# Patient Record
Sex: Male | Born: 1960 | Race: White | Hispanic: No | Marital: Married | Smoking: Former smoker
Health system: Southern US, Community
[De-identification: ages and names within clinical notes are randomized; demographics above are authoritative.]

## PROBLEM LIST (undated history)

## (undated) DIAGNOSIS — E119 Type 2 diabetes mellitus without complications: Secondary | ICD-10-CM

## (undated) HISTORY — PX: OTHER SURGICAL HISTORY: SHX169

## (undated) HISTORY — PX: UMBILICAL HERNIA REPAIR: SHX196

## (undated) HISTORY — DX: Type 2 diabetes mellitus without complications: E11.9

---

## 2003-10-29 ENCOUNTER — Emergency Department (HOSPITAL_COMMUNITY): Admission: AD | Admit: 2003-10-29 | Discharge: 2003-10-29 | Payer: Self-pay | Admitting: Family Medicine

## 2004-05-25 ENCOUNTER — Emergency Department (HOSPITAL_COMMUNITY): Admission: EM | Admit: 2004-05-25 | Discharge: 2004-05-25 | Payer: Self-pay | Admitting: Family Medicine

## 2009-10-30 ENCOUNTER — Encounter: Admission: RE | Admit: 2009-10-30 | Discharge: 2009-10-30 | Payer: Self-pay | Admitting: General Surgery

## 2009-10-31 ENCOUNTER — Emergency Department (HOSPITAL_COMMUNITY): Admission: EM | Admit: 2009-10-31 | Discharge: 2009-10-31 | Payer: Self-pay | Admitting: Emergency Medicine

## 2010-05-11 ENCOUNTER — Emergency Department (HOSPITAL_COMMUNITY): Admission: EM | Admit: 2010-05-11 | Discharge: 2010-05-11 | Payer: Self-pay | Admitting: Family Medicine

## 2014-10-30 ENCOUNTER — Other Ambulatory Visit: Payer: Self-pay | Admitting: Internal Medicine

## 2014-10-30 ENCOUNTER — Ambulatory Visit
Admission: RE | Admit: 2014-10-30 | Discharge: 2014-10-30 | Disposition: A | Discharge status: Home / Self Care | Payer: BC Managed Care – PPO | Source: Ambulatory Visit | Attending: Internal Medicine | Admitting: Internal Medicine

## 2014-10-30 DIAGNOSIS — R200 Anesthesia of skin: Secondary | ICD-10-CM

## 2014-11-25 ENCOUNTER — Telehealth: Payer: Self-pay | Admitting: Neurology

## 2014-11-25 NOTE — Telephone Encounter (Signed)
Pt moved appt from 12-27-14 to 11-26-14

## 2014-11-26 ENCOUNTER — Encounter: Payer: Self-pay | Admitting: Neurology

## 2014-11-26 ENCOUNTER — Ambulatory Visit (INDEPENDENT_AMBULATORY_CARE_PROVIDER_SITE_OTHER): Payer: BLUE CROSS/BLUE SHIELD | Admitting: Neurology

## 2014-11-26 VITALS — BP 130/80 | HR 89 | Ht 71.0 in | Wt 236.1 lb

## 2014-11-26 DIAGNOSIS — M5417 Radiculopathy, lumbosacral region: Secondary | ICD-10-CM

## 2014-11-26 DIAGNOSIS — R292 Abnormal reflex: Secondary | ICD-10-CM

## 2014-11-26 DIAGNOSIS — R202 Paresthesia of skin: Secondary | ICD-10-CM

## 2014-11-26 NOTE — Patient Instructions (Signed)
1.  MRI brain wwo contrast 2.  We will request your prior MRI lumbar spine 3.  Please try to avoid hyperflexion of the elbow when sleeping 4.  If symptoms do not improve, please contact my office to schedule EMG

## 2014-11-26 NOTE — Progress Notes (Signed)
[facility] Clinic Note - Initial Visit   Date: 11/26/2014  [name] MRN: [id number] DOB: Aug 02, 1961   Dear Dr. Philip Aspen:  Thank you for your kind referral of Craig Moreno for consultation of left finger and toe paresthesias. Although his history is well known to you, please allow [place] to reiterate it for the purpose of our medical record. The patient was accompanied to the clinic by self.   History of Present Illness: Craig Moreno is a 54 y.o. right-handed Caucasian male with type 2 diabetes, hyperlipidemia, and lumbar disc hernation s/p L5-S1 diskectomy presenting for evaluation of left finger and toe paresthesias.    In October 2014, he developed severe left leg pain and was found to have disc herniation.  He underwent surgery in spring of 2015 which resolved his radicular pain.  A few months later, he developed dull pain over the top of the foot and was told he had a stress fracture.  He was placed in a boot from May until August 2015 which alleviated the pain over the dorsum of the foot, but he continues to have numbness of the toe.  He has no problems involving the right foot.  A few months ago he had left sided neck pain, but this resolved spontaneously.  Occasionally, he noticed tingling of the 5th digit (left > right) usually when he wakes up in the morning, but this improves within a few minutes.     Out-side paper records, electronic medical record, and images have been reviewed where available and summarized as:  XR cervical spine 10/30/2014:  Mild cervical spondylosis as described. No acute osseous findings, malalignment or osseous foraminal stenosis.  XR lumbar spine 10/30/2014:  Stable lower lumbar spondylosis and postsurgical changes. No acute osseous findings or malalignment.    Past Medical History  Diagnosis Date  . Diabetes     borderline    Past Surgical History  Procedure Laterality Date  . L5s1 surgery    . Umbilical  hernia repair       Medications:  Farixga 10mg  daily Pravastatin 20mg  daily Flonase 84mcg nasal spray Mobic 15mg  daily prn   Allergies:  Allergies  Allergen Reactions  . Penicillins     Family History: Family History  Problem Relation Age of Onset  . Multiple sclerosis Sister   . Crohn's disease Father     Social History: He sells Adult nurse estate. Highest level of education:  [employer] with wife and daughter.   Social History Main Topics  . Smoking status: Former Research scientist (life sciences)  . Smokeless tobacco: Never Used     Comment: quit about 30 years ago  . Alcohol Use: 0.0 oz/week    Rare    Review of Systems:  CONSTITUTIONAL: No fevers, chills, night sweats, or weight loss.   EYES: No visual changes or eye pain ENT: No hearing changes.  No history of nose bleeds.   RESPIRATORY: No cough, wheezing and shortness of breath.   CARDIOVASCULAR: Negative for chest pain, and palpitations.   GI: Negative for abdominal discomfort, blood in stools or black stools.  No recent change in bowel habits.   GU:  No history of incontinence.   MUSCLOSKELETAL: No history of joint pain or swelling.  No myalgias.   SKIN: Negative for lesions, rash, and itching.   HEMATOLOGY/ONCOLOGY: Negative for prolonged bleeding, bruising easily, and swollen nodes.  No history of cancer.   ENDOCRINE: Negative for cold or heat intolerance, polydipsia or goiter.  PSYCH:  No depression or anxiety symptoms.   NEURO: As Above.   Vital Signs:  BP 130/80 mmHg  Pulse 89  Ht 5\' 11"  (1.803 m)  Wt 236 lb 2 oz (107.106 kg)  BMI 32.95 kg/m2  SpO2 96%   General Medical Exam:   General:  Well appearing, comfortable.   Eyes/ENT: see cranial nerve examination.   Neck: No masses appreciated.  Full range of motion without tenderness.  No carotid bruits. Respiratory:  Clear to auscultation, good air entry bilaterally.   Cardiac:  Regular rate and rhythm, no murmur.   Extremities:  No deformities, edema, or  skin discoloration.  Skin:  No rashes or lesions.  Neurological Exam: MENTAL STATUS including orientation to time, place, person, recent and remote memory, attention span and concentration, language, and fund of knowledge is normal.  Speech is not dysarthric.  CRANIAL NERVES: II:  No visual field defects.  Unremarkable fundi.   III-IV-VI: Pupils equal round and reactive to light.  Normal conjugate, extra-ocular eye movements in all directions of gaze.  No nystagmus.  No ptosis.   V:  Normal facial sensation.  Jaw jerk is absent.   VII:  Normal facial symmetry and movements.  No pathologic facial reflexes.  VIII:  Normal hearing and vestibular function.   IX-X:  Normal palatal movement.   XI:  Normal shoulder shrug and head rotation.   XII:  Normal tongue strength and range of motion, no deviation or fasciculation.  MOTOR:  No atrophy, fasciculations or abnormal movements.  No pronator drift.  Tone is normal.    Right Upper Extremity:    Left Upper Extremity:    Deltoid  5/5   Deltoid  5/5   Biceps  5/5   Biceps  5/5   Triceps  5/5   Triceps  5/5   Wrist extensors  5/5   Wrist extensors  5/5   Wrist flexors  5/5   Wrist flexors  5/5   Finger extensors  5/5   Finger extensors  5/5   Finger flexors  5/5   Finger flexors  5/5   Dorsal interossei  5/5   Dorsal interossei  5/5   Abductor pollicis  5/5   Abductor pollicis  5/5   Tone (Ashworth scale)  0  Tone (Ashworth scale)  0   Right Lower Extremity:    Left Lower Extremity:    Hip flexors  5/5   Hip flexors  5/5   Hip extensors  5/5   Hip extensors  5/5   Knee flexors  5/5   Knee flexors  5/5   Knee extensors  5/5   Knee extensors  5/5   Dorsiflexors  5/5   Dorsiflexors  5/5   Plantarflexors  5/5   Plantarflexors  5/5   Toe extensors  5/5   Toe extensors  5/5   Toe flexors  5/5   Toe flexors  5/5   Tone (Ashworth scale)  0  Tone (Ashworth scale)  0   MSRs:  Right                                                                  Left brachioradialis 3+  brachioradialis 3+  biceps 3+  biceps 3+  triceps 3+  triceps 3+  patellar 3+  patellar 3+  ankle jerk 3+  ankle jerk 3+  Hoffman no  Hoffman no  plantar response down  plantar response down   SENSORY:  Normal and symmetric perception of light touch, pinprick, vibration, and proprioception.  Romberg's sign absent.   COORDINATION/GAIT: Normal finger-to- nose-finger and heel-to-shin.  Intact rapid alternating movements bilaterally.  Able to rise from a chair without using arms.  Gait narrow based and stable. Tandem and stressed gait intact.    IMPRESSION: Mr. Behrle is a 54 year-old gentleman presenting for evaluation of bilateral 5th digit paresthesias and left 5th toe paresthesias.  Exam shows generalized hyperreflexia without other upper motor neuron findings and although this may be normal for patient, with his multifocal paresthesias, I will obtain MRI brain to exclude demyelinating disease.  If imaging is negative, symptoms are most likely due to (1) mild ulnar neuropathy at the elbow vs C8 radiculopathy and (2) possible left S1 radiculopathy vs sural mononeuropathy.  We will request MRI lumbar spine report from 2014 to see if there was, indeed, nerve impingement affecting the left S1 nerve root.     PLAN: MRI brain wwo contrast Recommend EMG of the left arm and leg, if symptoms worsen. Patient declined neurlagesic medication as paresthesias are not painful. Results will be communicated to patient via telephone Return to clinic as needed   The duration of this appointment visit was 45 minutes of face-to-face time with the patient.  Greater than 50% of this time was spent in counseling, explanation of diagnosis, planning of further management, and coordination of care.   Thank you for allowing me to participate in patient's care.  If I can answer any additional questions, I would be pleased to do so.    Sincerely,    Donika K. Posey Pronto, DO

## 2014-11-26 NOTE — Progress Notes (Signed)
Note faxed.

## 2014-11-28 NOTE — Progress Notes (Signed)
Addendum MRI lumbar spine 09/25/2013:  L5-S1: Moderate left central caudal extrusion superimposed on retrolisthesis of L5 on S1 and posterior endplate spurs displaces the left S1 root. Signs of left hemi-laminectomy.

## 2014-12-01 ENCOUNTER — Inpatient Hospital Stay: Admission: RE | Admit: 2014-12-01 | Payer: BLUE CROSS/BLUE SHIELD | Source: Ambulatory Visit

## 2014-12-16 ENCOUNTER — Telehealth: Payer: Self-pay | Admitting: Neurology

## 2014-12-16 NOTE — Telephone Encounter (Signed)
Pt called/returning your call.  C/b [phone]

## 2014-12-16 NOTE — Telephone Encounter (Signed)
Pt wants to know the status of the MRI resch please call [phone]

## 2014-12-16 NOTE — Telephone Encounter (Signed)
I spoke with patient. He was checking on the status of getting his MRI rescheduled which was cancelled on 1/10 due to not being precerted. I told patient that I would call the precert coordinator to check on the status of pre cert & I would give him a callback.  I called Janelle & she said she has pre-certed scan & patient could go ahead and reschedule scan.  I called patient &  Left a msg on his vm that he could call [facility] @ [phone] to reschedule his scan they still have the original order.

## 2014-12-16 NOTE — Telephone Encounter (Signed)
Lmom to return my call. 

## 2014-12-27 ENCOUNTER — Ambulatory Visit: Payer: BC Managed Care – PPO | Admitting: Neurology

## 2014-12-29 ENCOUNTER — Inpatient Hospital Stay: Admission: RE | Admit: 2014-12-29 | Payer: BLUE CROSS/BLUE SHIELD | Source: Ambulatory Visit

## 2016-03-10 DIAGNOSIS — H5203 Hypermetropia, bilateral: Secondary | ICD-10-CM | POA: Diagnosis not present

## 2016-03-16 DIAGNOSIS — Z0000 Encounter for general adult medical examination without abnormal findings: Secondary | ICD-10-CM | POA: Diagnosis not present

## 2016-03-16 DIAGNOSIS — R7301 Impaired fasting glucose: Secondary | ICD-10-CM | POA: Diagnosis not present

## 2016-03-16 DIAGNOSIS — Z125 Encounter for screening for malignant neoplasm of prostate: Secondary | ICD-10-CM | POA: Diagnosis not present

## 2016-03-22 DIAGNOSIS — R7301 Impaired fasting glucose: Secondary | ICD-10-CM | POA: Diagnosis not present

## 2016-03-22 DIAGNOSIS — M25512 Pain in left shoulder: Secondary | ICD-10-CM | POA: Diagnosis not present

## 2016-03-22 DIAGNOSIS — E668 Other obesity: Secondary | ICD-10-CM | POA: Diagnosis not present

## 2016-03-22 DIAGNOSIS — Z0000 Encounter for general adult medical examination without abnormal findings: Secondary | ICD-10-CM | POA: Diagnosis not present

## 2016-03-22 DIAGNOSIS — R5383 Other fatigue: Secondary | ICD-10-CM | POA: Diagnosis not present

## 2016-03-22 DIAGNOSIS — Z1389 Encounter for screening for other disorder: Secondary | ICD-10-CM | POA: Diagnosis not present

## 2016-03-26 DIAGNOSIS — Z1212 Encounter for screening for malignant neoplasm of rectum: Secondary | ICD-10-CM | POA: Diagnosis not present

## 2016-10-01 DIAGNOSIS — M546 Pain in thoracic spine: Secondary | ICD-10-CM | POA: Diagnosis not present

## 2016-10-01 DIAGNOSIS — M5416 Radiculopathy, lumbar region: Secondary | ICD-10-CM | POA: Diagnosis not present

## 2016-10-01 DIAGNOSIS — M549 Dorsalgia, unspecified: Secondary | ICD-10-CM | POA: Diagnosis not present

## 2016-10-01 DIAGNOSIS — M5136 Other intervertebral disc degeneration, lumbar region: Secondary | ICD-10-CM | POA: Diagnosis not present

## 2016-10-01 DIAGNOSIS — M47816 Spondylosis without myelopathy or radiculopathy, lumbar region: Secondary | ICD-10-CM | POA: Diagnosis not present

## 2016-10-08 DIAGNOSIS — J40 Bronchitis, not specified as acute or chronic: Secondary | ICD-10-CM | POA: Diagnosis not present

## 2016-10-08 DIAGNOSIS — Z6830 Body mass index (BMI) 30.0-30.9, adult: Secondary | ICD-10-CM | POA: Diagnosis not present

## 2016-11-09 DIAGNOSIS — J069 Acute upper respiratory infection, unspecified: Secondary | ICD-10-CM | POA: Diagnosis not present

## 2016-11-09 DIAGNOSIS — R05 Cough: Secondary | ICD-10-CM | POA: Diagnosis not present

## 2017-03-21 DIAGNOSIS — E784 Other hyperlipidemia: Secondary | ICD-10-CM | POA: Diagnosis not present

## 2017-03-21 DIAGNOSIS — Z125 Encounter for screening for malignant neoplasm of prostate: Secondary | ICD-10-CM | POA: Diagnosis not present

## 2017-03-21 DIAGNOSIS — R7301 Impaired fasting glucose: Secondary | ICD-10-CM | POA: Diagnosis not present

## 2017-03-28 DIAGNOSIS — Z0000 Encounter for general adult medical examination without abnormal findings: Secondary | ICD-10-CM | POA: Diagnosis not present

## 2017-03-28 DIAGNOSIS — E784 Other hyperlipidemia: Secondary | ICD-10-CM | POA: Diagnosis not present

## 2017-03-28 DIAGNOSIS — E669 Obesity, unspecified: Secondary | ICD-10-CM | POA: Diagnosis not present

## 2017-03-28 DIAGNOSIS — Z1389 Encounter for screening for other disorder: Secondary | ICD-10-CM | POA: Diagnosis not present

## 2017-03-28 DIAGNOSIS — R7301 Impaired fasting glucose: Secondary | ICD-10-CM | POA: Diagnosis not present

## 2017-03-29 DIAGNOSIS — Z1212 Encounter for screening for malignant neoplasm of rectum: Secondary | ICD-10-CM | POA: Diagnosis not present

## 2017-06-16 DIAGNOSIS — M25562 Pain in left knee: Secondary | ICD-10-CM | POA: Diagnosis not present

## 2017-06-27 DIAGNOSIS — M25562 Pain in left knee: Secondary | ICD-10-CM | POA: Diagnosis not present

## 2017-07-01 DIAGNOSIS — M25562 Pain in left knee: Secondary | ICD-10-CM | POA: Diagnosis not present

## 2017-07-15 DIAGNOSIS — H5201 Hypermetropia, right eye: Secondary | ICD-10-CM | POA: Diagnosis not present

## 2017-09-17 DIAGNOSIS — Z23 Encounter for immunization: Secondary | ICD-10-CM | POA: Diagnosis not present

## 2018-02-17 ENCOUNTER — Ambulatory Visit (INDEPENDENT_AMBULATORY_CARE_PROVIDER_SITE_OTHER): Payer: BLUE CROSS/BLUE SHIELD | Admitting: Internal Medicine

## 2018-02-17 ENCOUNTER — Encounter: Payer: Self-pay | Admitting: Internal Medicine

## 2018-02-17 DIAGNOSIS — Z7184 Encounter for health counseling related to travel: Secondary | ICD-10-CM

## 2018-02-17 DIAGNOSIS — Z9189 Other specified personal risk factors, not elsewhere classified: Secondary | ICD-10-CM

## 2018-02-17 DIAGNOSIS — Z7185 Encounter for immunization safety counseling: Secondary | ICD-10-CM

## 2018-02-17 DIAGNOSIS — Z23 Encounter for immunization: Secondary | ICD-10-CM

## 2018-02-17 DIAGNOSIS — Z7189 Other specified counseling: Secondary | ICD-10-CM | POA: Diagnosis not present

## 2018-02-17 DIAGNOSIS — Z0000 Encounter for general adult medical examination without abnormal findings: Secondary | ICD-10-CM

## 2018-02-17 DIAGNOSIS — Z789 Other specified health status: Secondary | ICD-10-CM

## 2018-02-17 MED ORDER — ATOVAQUONE-PROGUANIL HCL 250-100 MG PO TABS: 1.0000 | ORAL_TABLET | Freq: Every day | ORAL | 0 refills | Status: DC

## 2018-02-17 MED ORDER — AZITHROMYCIN 500 MG PO TABS: 1000.0000 mg | ORAL_TABLET | Freq: Once | ORAL | 0 refills | Status: AC

## 2018-02-17 NOTE — Progress Notes (Signed)
Subjective:   Craig Moreno is a 57 y.o. male who presents to the Infectious Disease clinic for travel consultation. Planned departure date: May 2019          Planned return date: 2 weeks Countries of travel: [place] and [place] and [place] Areas in country: safari   Accommodations: safari Purpose of travel: vacation Prior travel out of [place]: yes     Objective:   Medications: reviewed    Assessment:   No contraindications to travel. none     Plan:    Issues discussed: environmental concerns, freshwater swimming, future shots, insect-borne illnesses, malaria, MVA safety, rabies, safe food/water, traveler's diarrhea, website/handouts for more information, what to do if ill upon return, what to do if ill while there and Yellow Fever. Immunizations recommended: Hepatitis A series, Td and Typhoid (parenteral). Malaria prophylaxis: malarone, daily dose starting 1-2 days before entering endemic area, ending 7 days after leaving area Traveler's diarrhea prophylaxis: azithromycin. Total duration of visit: 1 Hour. Total time spent on education, counseling, coordination of care: 30 Minutes.

## 2018-03-03 NOTE — Addendum Note (Signed)
Addended by: Landis Gandy on: 03/03/2018 09:49 AM   Modules accepted: Orders

## 2018-04-14 DIAGNOSIS — Z125 Encounter for screening for malignant neoplasm of prostate: Secondary | ICD-10-CM | POA: Diagnosis not present

## 2018-04-14 DIAGNOSIS — Z0000 Encounter for general adult medical examination without abnormal findings: Secondary | ICD-10-CM | POA: Diagnosis not present

## 2018-04-14 DIAGNOSIS — R7301 Impaired fasting glucose: Secondary | ICD-10-CM | POA: Diagnosis not present

## 2018-05-04 DIAGNOSIS — M5136 Other intervertebral disc degeneration, lumbar region: Secondary | ICD-10-CM | POA: Diagnosis not present

## 2018-05-04 DIAGNOSIS — E668 Other obesity: Secondary | ICD-10-CM | POA: Diagnosis not present

## 2018-05-04 DIAGNOSIS — R5383 Other fatigue: Secondary | ICD-10-CM | POA: Diagnosis not present

## 2018-05-04 DIAGNOSIS — R7301 Impaired fasting glucose: Secondary | ICD-10-CM | POA: Diagnosis not present

## 2018-05-04 DIAGNOSIS — Z1389 Encounter for screening for other disorder: Secondary | ICD-10-CM | POA: Diagnosis not present

## 2018-05-04 DIAGNOSIS — Z0000 Encounter for general adult medical examination without abnormal findings: Secondary | ICD-10-CM | POA: Diagnosis not present

## 2018-05-08 DIAGNOSIS — Z1212 Encounter for screening for malignant neoplasm of rectum: Secondary | ICD-10-CM | POA: Diagnosis not present

## 2018-10-13 DIAGNOSIS — Z23 Encounter for immunization: Secondary | ICD-10-CM | POA: Diagnosis not present

## 2018-11-03 DIAGNOSIS — R7301 Impaired fasting glucose: Secondary | ICD-10-CM | POA: Diagnosis not present

## 2018-11-03 DIAGNOSIS — Z6830 Body mass index (BMI) 30.0-30.9, adult: Secondary | ICD-10-CM | POA: Diagnosis not present

## 2018-11-03 DIAGNOSIS — E7849 Other hyperlipidemia: Secondary | ICD-10-CM | POA: Diagnosis not present

## 2019-05-15 DIAGNOSIS — R7301 Impaired fasting glucose: Secondary | ICD-10-CM | POA: Diagnosis not present

## 2019-05-15 DIAGNOSIS — Z0000 Encounter for general adult medical examination without abnormal findings: Secondary | ICD-10-CM | POA: Diagnosis not present

## 2019-05-15 DIAGNOSIS — E7849 Other hyperlipidemia: Secondary | ICD-10-CM | POA: Diagnosis not present

## 2019-05-15 DIAGNOSIS — Z125 Encounter for screening for malignant neoplasm of prostate: Secondary | ICD-10-CM | POA: Diagnosis not present

## 2019-05-15 DIAGNOSIS — R82998 Other abnormal findings in urine: Secondary | ICD-10-CM | POA: Diagnosis not present

## 2019-05-22 DIAGNOSIS — Z1331 Encounter for screening for depression: Secondary | ICD-10-CM | POA: Diagnosis not present

## 2019-05-22 DIAGNOSIS — R5383 Other fatigue: Secondary | ICD-10-CM | POA: Diagnosis not present

## 2019-05-22 DIAGNOSIS — E785 Hyperlipidemia, unspecified: Secondary | ICD-10-CM | POA: Diagnosis not present

## 2019-05-22 DIAGNOSIS — M5136 Other intervertebral disc degeneration, lumbar region: Secondary | ICD-10-CM | POA: Diagnosis not present

## 2019-05-22 DIAGNOSIS — R7301 Impaired fasting glucose: Secondary | ICD-10-CM | POA: Diagnosis not present

## 2019-05-22 DIAGNOSIS — Z0000 Encounter for general adult medical examination without abnormal findings: Secondary | ICD-10-CM | POA: Diagnosis not present

## 2019-08-17 DIAGNOSIS — Z23 Encounter for immunization: Secondary | ICD-10-CM | POA: Diagnosis not present

## 2019-11-07 DIAGNOSIS — L821 Other seborrheic keratosis: Secondary | ICD-10-CM | POA: Diagnosis not present

## 2019-11-07 DIAGNOSIS — L570 Actinic keratosis: Secondary | ICD-10-CM | POA: Diagnosis not present

## 2019-11-07 DIAGNOSIS — B078 Other viral warts: Secondary | ICD-10-CM | POA: Diagnosis not present

## 2019-11-07 DIAGNOSIS — D485 Neoplasm of uncertain behavior of skin: Secondary | ICD-10-CM | POA: Diagnosis not present

## 2019-11-09 ENCOUNTER — Ambulatory Visit: Payer: BC Managed Care – PPO | Attending: Internal Medicine

## 2019-11-09 DIAGNOSIS — U071 COVID-19: Secondary | ICD-10-CM

## 2019-11-09 DIAGNOSIS — Z20828 Contact with and (suspected) exposure to other viral communicable diseases: Secondary | ICD-10-CM | POA: Insufficient documentation

## 2019-11-09 DIAGNOSIS — R238 Other skin changes: Secondary | ICD-10-CM | POA: Insufficient documentation

## 2019-11-10 LAB — NOVEL CORONAVIRUS, NAA: SARS-CoV-2, NAA: NOT DETECTED

## 2019-11-26 ENCOUNTER — Ambulatory Visit: Payer: BC Managed Care – PPO | Attending: Internal Medicine

## 2019-11-26 DIAGNOSIS — R7301 Impaired fasting glucose: Secondary | ICD-10-CM | POA: Diagnosis not present

## 2019-11-26 DIAGNOSIS — R200 Anesthesia of skin: Secondary | ICD-10-CM | POA: Diagnosis not present

## 2019-11-26 DIAGNOSIS — Z20822 Contact with and (suspected) exposure to covid-19: Secondary | ICD-10-CM

## 2019-11-26 DIAGNOSIS — R05 Cough: Secondary | ICD-10-CM | POA: Diagnosis not present

## 2019-11-27 LAB — NOVEL CORONAVIRUS, NAA: SARS-CoV-2, NAA: DETECTED — AB

## 2019-12-14 DIAGNOSIS — R7301 Impaired fasting glucose: Secondary | ICD-10-CM | POA: Diagnosis not present

## 2019-12-21 DIAGNOSIS — Z03818 Encounter for observation for suspected exposure to other biological agents ruled out: Secondary | ICD-10-CM | POA: Diagnosis not present

## 2019-12-25 DIAGNOSIS — M5416 Radiculopathy, lumbar region: Secondary | ICD-10-CM | POA: Diagnosis not present

## 2019-12-25 DIAGNOSIS — R200 Anesthesia of skin: Secondary | ICD-10-CM | POA: Diagnosis not present

## 2019-12-25 DIAGNOSIS — R7303 Prediabetes: Secondary | ICD-10-CM | POA: Diagnosis not present

## 2020-01-16 DIAGNOSIS — E119 Type 2 diabetes mellitus without complications: Secondary | ICD-10-CM | POA: Diagnosis not present

## 2020-05-20 DIAGNOSIS — Z0000 Encounter for general adult medical examination without abnormal findings: Secondary | ICD-10-CM | POA: Diagnosis not present

## 2020-05-20 DIAGNOSIS — R7301 Impaired fasting glucose: Secondary | ICD-10-CM | POA: Diagnosis not present

## 2020-05-20 DIAGNOSIS — E7849 Other hyperlipidemia: Secondary | ICD-10-CM | POA: Diagnosis not present

## 2020-05-20 DIAGNOSIS — Z125 Encounter for screening for malignant neoplasm of prostate: Secondary | ICD-10-CM | POA: Diagnosis not present

## 2020-05-27 DIAGNOSIS — R82998 Other abnormal findings in urine: Secondary | ICD-10-CM | POA: Diagnosis not present

## 2020-05-27 DIAGNOSIS — R7301 Impaired fasting glucose: Secondary | ICD-10-CM | POA: Diagnosis not present

## 2020-05-27 DIAGNOSIS — Z0000 Encounter for general adult medical examination without abnormal findings: Secondary | ICD-10-CM | POA: Diagnosis not present

## 2020-05-27 DIAGNOSIS — R200 Anesthesia of skin: Secondary | ICD-10-CM | POA: Diagnosis not present

## 2020-05-27 DIAGNOSIS — E669 Obesity, unspecified: Secondary | ICD-10-CM | POA: Diagnosis not present

## 2020-05-27 DIAGNOSIS — N529 Male erectile dysfunction, unspecified: Secondary | ICD-10-CM | POA: Diagnosis not present

## 2020-05-27 DIAGNOSIS — Z1389 Encounter for screening for other disorder: Secondary | ICD-10-CM | POA: Diagnosis not present

## 2020-08-30 DIAGNOSIS — Z23 Encounter for immunization: Secondary | ICD-10-CM | POA: Diagnosis not present

## 2020-10-24 ENCOUNTER — Ambulatory Visit: Payer: BC Managed Care – PPO | Attending: Internal Medicine

## 2020-10-24 DIAGNOSIS — Z23 Encounter for immunization: Secondary | ICD-10-CM

## 2020-10-24 NOTE — Progress Notes (Signed)
   Covid-19 Vaccination Clinic  Name:  Craig Moreno    MRN: [id number] DOB: 11-May-1961  10/24/2020  [name] was observed post Covid-19 immunization for 15 minutes without incident. He was provided with Vaccine Information Sheet and instruction to access the V-Safe system.   [name] was instructed to call 911 with any severe reactions post vaccine: Marland Kitchen Difficulty breathing  . Swelling of face and throat  . A fast heartbeat  . A bad rash all over body  . Dizziness and weakness   Immunizations Administered    Name Date Dose VIS Date Route   Pfizer COVID-19 Vaccine 10/24/2020  3:24 PM 0.3 mL 09/10/2020 Intramuscular   Manufacturer: [place]   Lot: [id number]   NDC: [address]-2

## 2020-11-10 DIAGNOSIS — D225 Melanocytic nevi of trunk: Secondary | ICD-10-CM | POA: Diagnosis not present

## 2020-11-10 DIAGNOSIS — D2271 Melanocytic nevi of right lower limb, including hip: Secondary | ICD-10-CM | POA: Diagnosis not present

## 2020-11-10 DIAGNOSIS — L570 Actinic keratosis: Secondary | ICD-10-CM | POA: Diagnosis not present

## 2020-11-10 DIAGNOSIS — B078 Other viral warts: Secondary | ICD-10-CM | POA: Diagnosis not present

## 2020-11-10 DIAGNOSIS — D485 Neoplasm of uncertain behavior of skin: Secondary | ICD-10-CM | POA: Diagnosis not present

## 2020-11-10 DIAGNOSIS — D2272 Melanocytic nevi of left lower limb, including hip: Secondary | ICD-10-CM | POA: Diagnosis not present

## 2020-11-10 DIAGNOSIS — L821 Other seborrheic keratosis: Secondary | ICD-10-CM | POA: Diagnosis not present

## 2021-06-05 DIAGNOSIS — R7301 Impaired fasting glucose: Secondary | ICD-10-CM | POA: Diagnosis not present

## 2021-06-05 DIAGNOSIS — Z125 Encounter for screening for malignant neoplasm of prostate: Secondary | ICD-10-CM | POA: Diagnosis not present

## 2021-06-05 DIAGNOSIS — E785 Hyperlipidemia, unspecified: Secondary | ICD-10-CM | POA: Diagnosis not present

## 2021-06-12 DIAGNOSIS — Z0000 Encounter for general adult medical examination without abnormal findings: Secondary | ICD-10-CM | POA: Diagnosis not present

## 2021-06-12 DIAGNOSIS — M5416 Radiculopathy, lumbar region: Secondary | ICD-10-CM | POA: Diagnosis not present

## 2021-06-12 DIAGNOSIS — R82998 Other abnormal findings in urine: Secondary | ICD-10-CM | POA: Diagnosis not present

## 2021-09-22 ENCOUNTER — Other Ambulatory Visit: Payer: Self-pay | Admitting: Internal Medicine

## 2021-09-22 ENCOUNTER — Ambulatory Visit
Admission: RE | Admit: 2021-09-22 | Discharge: 2021-09-22 | Disposition: A | Discharge status: Home / Self Care | Payer: BC Managed Care – PPO | Source: Ambulatory Visit | Attending: Internal Medicine | Admitting: Internal Medicine

## 2021-09-22 DIAGNOSIS — I700 Atherosclerosis of aorta: Secondary | ICD-10-CM | POA: Diagnosis not present

## 2021-09-22 DIAGNOSIS — R1032 Left lower quadrant pain: Secondary | ICD-10-CM

## 2021-09-22 DIAGNOSIS — K769 Liver disease, unspecified: Secondary | ICD-10-CM | POA: Diagnosis not present

## 2021-09-22 DIAGNOSIS — E785 Hyperlipidemia, unspecified: Secondary | ICD-10-CM | POA: Diagnosis not present

## 2021-09-22 DIAGNOSIS — K529 Noninfective gastroenteritis and colitis, unspecified: Secondary | ICD-10-CM | POA: Diagnosis not present

## 2021-09-22 DIAGNOSIS — K5732 Diverticulitis of large intestine without perforation or abscess without bleeding: Secondary | ICD-10-CM | POA: Diagnosis not present

## 2021-09-22 MED ORDER — IOPAMIDOL (ISOVUE-300) INJECTION 61%
100.0000 mL | Freq: Once | INTRAVENOUS | Status: AC | PRN
  Administered 2021-09-22: 100 mL via INTRAVENOUS

## 2021-11-10 DIAGNOSIS — R051 Acute cough: Secondary | ICD-10-CM | POA: Diagnosis not present

## 2021-11-10 DIAGNOSIS — J9801 Acute bronchospasm: Secondary | ICD-10-CM | POA: Diagnosis not present

## 2022-01-01 ENCOUNTER — Telehealth: Payer: Self-pay | Admitting: *Deleted

## 2022-01-01 ENCOUNTER — Encounter: Payer: Self-pay | Admitting: *Deleted

## 2022-01-01 DIAGNOSIS — Z01818 Encounter for other preprocedural examination: Secondary | ICD-10-CM

## 2022-01-01 DIAGNOSIS — Z1211 Encounter for screening for malignant neoplasm of colon: Secondary | ICD-10-CM

## 2022-01-01 MED ORDER — SUTAB 1479-225-188 MG PO TABS: ORAL_TABLET | ORAL | 0 refills | Status: DC

## 2022-01-01 NOTE — Telephone Encounter (Signed)
Dr Hilarie Fredrickson has requested that patient schedule screening colonoscopy. Patient has been scheduled for 02/18/22 at 930 am. He has been given detailed verbal instructions regarding colonoscopy prep and his need to bring someone 18 or older to be his carepartner and provide transportation. Patient indicates that he is not taking any medications at this time and has allergy to PCN only.   Written instructions mailed to patient and made available via mychart.

## 2022-01-21 DIAGNOSIS — L821 Other seborrheic keratosis: Secondary | ICD-10-CM | POA: Diagnosis not present

## 2022-01-21 DIAGNOSIS — L570 Actinic keratosis: Secondary | ICD-10-CM | POA: Diagnosis not present

## 2022-01-21 DIAGNOSIS — D1801 Hemangioma of skin and subcutaneous tissue: Secondary | ICD-10-CM | POA: Diagnosis not present

## 2022-02-18 ENCOUNTER — Encounter: Payer: BC Managed Care – PPO | Admitting: Internal Medicine

## 2022-02-22 DIAGNOSIS — R051 Acute cough: Secondary | ICD-10-CM | POA: Diagnosis not present

## 2022-02-22 DIAGNOSIS — R0989 Other specified symptoms and signs involving the circulatory and respiratory systems: Secondary | ICD-10-CM | POA: Diagnosis not present

## 2022-02-22 DIAGNOSIS — R0981 Nasal congestion: Secondary | ICD-10-CM | POA: Diagnosis not present

## 2022-02-22 DIAGNOSIS — Z20822 Contact with and (suspected) exposure to covid-19: Secondary | ICD-10-CM | POA: Diagnosis not present

## 2022-03-11 ENCOUNTER — Encounter: Payer: BC Managed Care – PPO | Admitting: Internal Medicine

## 2022-03-29 DIAGNOSIS — H02841 Edema of right upper eyelid: Secondary | ICD-10-CM | POA: Diagnosis not present

## 2022-03-31 ENCOUNTER — Encounter: Payer: BC Managed Care – PPO | Admitting: Internal Medicine

## 2022-04-09 ENCOUNTER — Encounter: Payer: Self-pay | Admitting: Internal Medicine

## 2022-04-16 ENCOUNTER — Ambulatory Visit (AMBULATORY_SURGERY_CENTER): Payer: BC Managed Care – PPO | Admitting: Internal Medicine

## 2022-04-16 ENCOUNTER — Encounter: Payer: Self-pay | Admitting: Internal Medicine

## 2022-04-16 VITALS — BP 109/70 | HR 61 | Temp 97.8°F | Resp 15 | Ht 71.5 in | Wt 200.8 lb

## 2022-04-16 DIAGNOSIS — Z1211 Encounter for screening for malignant neoplasm of colon: Secondary | ICD-10-CM | POA: Diagnosis not present

## 2022-04-16 DIAGNOSIS — D124 Benign neoplasm of descending colon: Secondary | ICD-10-CM | POA: Diagnosis not present

## 2022-04-16 MED ORDER — SODIUM CHLORIDE 0.9 % IV SOLN: 500.0000 mL | Freq: Once | INTRAVENOUS | Status: AC

## 2022-04-16 NOTE — Progress Notes (Signed)
   GASTROENTEROLOGY PROCEDURE H&P NOTE   Primary Care Physician: Donnajean Lopes, MD    Reason for Procedure:   screening  Plan:    colonoscopy  Patient is appropriate for endoscopic procedure(s) in the ambulatory ([facility]) setting.  The nature of the procedure, as well as the risks, benefits, and alternatives were carefully and thoroughly reviewed with the patient. Ample time for discussion and questions allowed. The patient understood, was satisfied, and agreed to proceed.     HPI: Craig Moreno is a 61 y.o. male who presents for screening colon.  Medical history as below.  Tolerated the prep.  No recent chest pain or shortness of breath.  No abdominal pain today.  Past Medical History:  Diagnosis Date   Diabetes ([place])    borderline    Past Surgical History:  Procedure Laterality Date   L5S1 surgery     UMBILICAL HERNIA REPAIR      Prior to Admission medications   Not on File    No current outpatient medications on file.   Current Facility-Administered Medications  Medication Dose Route Frequency Provider Last Rate Last Admin   0.9 %  sodium chloride infusion  500 mL Intravenous Once Ellamae Lybeck, Lajuan Lines, MD        Allergies as of 04/16/2022 - Review Complete 04/16/2022  Allergen Reaction Noted   Penicillins  11/26/2014    Family History  Problem Relation Age of Onset   Multiple sclerosis Sister    Crohn's disease Father     Social History   Socioeconomic History   Marital status: Married    Spouse name: Not on file   Number of children: Not on file   Years of education: Not on file   Highest education level: Not on file  Occupational History   Not on file  Tobacco Use   Smoking status: Former   Smokeless tobacco: Never   Tobacco comments:    quit about 30 years ago  Substance and Sexual Activity   Alcohol use: Yes    Alcohol/week: 0.0 standard drinks   Drug use: No   Sexual activity: Not on file  Other Topics Concern   Not on file  Social  History Narrative   Lives with wife and 2 children in a story home.  Works in Product manager.  Education: Forensic psychologist.     Social Determinants of Health   Financial Resource Strain: Not on file  Food Insecurity: Not on file  Transportation Needs: Not on file  Physical Activity: Not on file  Stress: Not on file  Social Connections: Not on file  Intimate Partner Violence: Not on file    Physical Exam: Vital signs in last 24 hours: '@BP'$  106/63   Pulse 76   Temp 97.8 F (36.6 C) (Temporal)   Ht 5' 11.5" (1.816 m)   Wt 200 lb 12.8 oz (91.1 kg)   SpO2 97%   BMI 27.62 kg/m  GEN: NAD EYE: Sclerae anicteric ENT: MMM CV: Non-tachycardic Pulm: CTA b/l GI: Soft, NT/ND NEURO:  Alert & Oriented x 3   Zenovia Jarred, MD [facility]  04/16/2022 9:17 AM

## 2022-04-16 NOTE — Progress Notes (Signed)
Called to room to assist during endoscopic procedure.  Patient ID and intended procedure confirmed with present staff. Received instructions for my participation in the procedure from the performing physician.  

## 2022-04-16 NOTE — Progress Notes (Signed)
I have reviewed the patient's medical history in detail and updated the computerized patient record.   VS BY HC.

## 2022-04-16 NOTE — Patient Instructions (Signed)
Impression/Recommendations:  Polyp, diverticulosis, and hemorrhoid handouts given to patient.  Resume previous diet. Continue present medications. Await pathology results.  Repeat colonoscopy recommended.  Date to be determined after pathology results reviewed.  YOU HAD AN ENDOSCOPIC PROCEDURE TODAY AT [facility]:   Refer to the procedure report that was given to you for any specific questions about what was found during the examination.  If the procedure report does not answer your questions, please call your gastroenterologist to clarify.  If you requested that your care partner not be given the details of your procedure findings, then the procedure report has been included in a sealed envelope for you to review at your convenience later.  YOU SHOULD EXPECT: Some feelings of bloating in the abdomen. Passage of more gas than usual.  Walking can help get rid of the air that was put into your GI tract during the procedure and reduce the bloating. If you had a lower endoscopy (such as a colonoscopy or flexible sigmoidoscopy) you may notice spotting of blood in your stool or on the toilet paper. If you underwent a bowel prep for your procedure, you may not have a normal bowel movement for a few days.  Please Note:  You might notice some irritation and congestion in your nose or some drainage.  This is from the oxygen used during your procedure.  There is no need for concern and it should clear up in a day or so.  SYMPTOMS TO REPORT IMMEDIATELY:  Following lower endoscopy (colonoscopy or flexible sigmoidoscopy):  Excessive amounts of blood in the stool  Significant tenderness or worsening of abdominal pains  Swelling of the abdomen that is new, acute  Fever of 100F or higher  For urgent or emergent issues, a gastroenterologist can be reached at any hour by calling [phone]. Do not use MyChart messaging for urgent concerns.    DIET:  We do recommend a small meal at  first, but then you may proceed to your regular diet.  Drink plenty of fluids but you should avoid alcoholic beverages for 24 hours.  ACTIVITY:  You should plan to take it easy for the rest of today and you should NOT DRIVE or use heavy machinery until tomorrow (because of the sedation medicines used during the test).    FOLLOW UP: Our staff will call the number listed on your records 48-72 hours following your procedure to check on you and address any questions or concerns that you may have regarding the information given to you following your procedure. If we do not reach you, we will leave a message.  We will attempt to reach you two times.  During this call, we will ask if you have developed any symptoms of COVID 19. If you develop any symptoms (ie: fever, flu-like symptoms, shortness of breath, cough etc.) before then, please call [phone].  If you test positive for Covid 19 in the 2 weeks post procedure, please call and report this information to [place].    If any biopsies were taken you will be contacted by phone or by letter within the next 1-3 weeks.  Please call us at [phone] if you have not heard about the biopsies in 3 weeks.    SIGNATURES/CONFIDENTIALITY: You and/or your care partner have signed paperwork which will be entered into your electronic medical record.  These signatures attest to the fact that that the information above on your After Visit Summary has been reviewed and is understood.  Full  responsibility of the confidentiality of this discharge information lies with you and/or your care-partner.  

## 2022-04-16 NOTE — Progress Notes (Signed)
To pacu, VSS. Report to [email or web address] 

## 2022-04-16 NOTE — Op Note (Signed)
[name]: Craig Moreno Procedure Date: 04/16/2022 8:44 AM MRN: [id number] Endoscopist: Jerene Bears , MD Age: 61 Referring MD:  Date of Birth: [birth year] Gender: Male Account #: [id number] Procedure:                Colonoscopy Indications:              Screening for colorectal malignant neoplasm, Last                            colonoscopy 10 years ago Medicines:                Monitored Anesthesia Care Procedure:                Pre-Anesthesia Assessment:                           - Prior to the procedure, a History and Physical                            was performed, and patient medications and                            allergies were reviewed. The patient's tolerance of                            previous anesthesia was also reviewed. The risks                            and benefits of the procedure and the sedation                            options and risks were discussed with the patient.                            All questions were answered, and informed consent                            was obtained. Prior Anticoagulants: The patient has                            taken no previous anticoagulant or antiplatelet                            agents. ASA Grade Assessment: II - A patient with                            mild systemic disease. After reviewing the risks                            and benefits, the patient was deemed in                            satisfactory condition to undergo the procedure.  After obtaining informed consent, the colonoscope                            was passed under direct vision. Throughout the                            procedure, the patient's blood pressure, pulse, and                            oxygen saturations were monitored continuously. The                            CF [id number] #[id number] was introduced through the anus                            and advanced to the terminal ileum. The  colonoscopy                            was performed without difficulty. The patient                            tolerated the procedure well. The quality of the                            bowel preparation was good. The terminal ileum,                            ileocecal valve, appendiceal orifice, and rectum                            were photographed. Scope In: 9:30:18 AM Scope Out: 9:47:34 AM Scope Withdrawal Time: 0 hours 13 minutes 21 seconds  Total Procedure Duration: 0 hours 17 minutes 16 seconds  Findings:                 The digital rectal exam was normal.                           The terminal ileum appeared normal.                           A 6 mm polyp was found in the descending colon. The                            polyp was sessile. The polyp was removed with a                            cold snare. Resection and retrieval were complete.                           Multiple small and large-mouthed diverticula were                            found in the sigmoid colon, descending colon,  transverse colon, ascending colon and cecum.                           Internal hemorrhoids were found during                            retroflexion. The hemorrhoids were small. Complications:            No immediate complications. Estimated Blood Loss:     Estimated blood loss: none. Impression:               - The examined portion of the ileum was normal.                           - One 6 mm polyp in the descending colon, removed                            with a cold snare. Resected and retrieved.                           - Moderate diverticulosis in the sigmoid colon, in                            the descending colon, in the transverse colon, in                            the ascending colon and in the cecum.                           - Small internal hemorrhoids. Recommendation:           - Patient has a contact number available for                             emergencies. The signs and symptoms of potential                            delayed complications were discussed with the                            patient. Return to normal activities tomorrow.                            Written discharge instructions were provided to the                            patient.                           - Resume previous diet.                           - Continue present medications.                           - Await pathology results.                           -  Repeat colonoscopy is recommended. The                            colonoscopy date will be determined after pathology                            results from today's exam become available for                            review. Jerene Bears, MD 04/16/2022 9:51:11 AM This report has been signed electronically.

## 2022-04-20 ENCOUNTER — Telehealth: Payer: Self-pay | Admitting: *Deleted

## 2022-04-20 NOTE — Telephone Encounter (Signed)
  Follow up Call-     04/16/2022    8:50 AM  Call back number  Post procedure Call Back phone  # [phone]  Permission to leave phone message Yes     Patient questions:  Do you have a fever, pain , or abdominal swelling? No. Pain Score  0 *  Have you tolerated food without any problems? Yes.    Have you been able to return to your normal activities? Yes.    Do you have any questions about your discharge instructions: Diet   No. Medications  No. Follow up visit  No.  Do you have questions or concerns about your Care? No.  Actions: * If pain score is 4 or above: No action needed, pain <4.

## 2022-04-23 ENCOUNTER — Encounter: Payer: Self-pay | Admitting: Internal Medicine

## 2022-05-19 DIAGNOSIS — H524 Presbyopia: Secondary | ICD-10-CM | POA: Diagnosis not present

## 2022-07-27 DIAGNOSIS — M25511 Pain in right shoulder: Secondary | ICD-10-CM | POA: Diagnosis not present

## 2022-08-16 DIAGNOSIS — Z125 Encounter for screening for malignant neoplasm of prostate: Secondary | ICD-10-CM | POA: Diagnosis not present

## 2022-08-16 DIAGNOSIS — E785 Hyperlipidemia, unspecified: Secondary | ICD-10-CM | POA: Diagnosis not present

## 2022-08-16 DIAGNOSIS — R7301 Impaired fasting glucose: Secondary | ICD-10-CM | POA: Diagnosis not present

## 2022-08-23 DIAGNOSIS — R7301 Impaired fasting glucose: Secondary | ICD-10-CM | POA: Diagnosis not present

## 2022-08-23 DIAGNOSIS — Z23 Encounter for immunization: Secondary | ICD-10-CM | POA: Diagnosis not present

## 2022-08-23 DIAGNOSIS — Z0000 Encounter for general adult medical examination without abnormal findings: Secondary | ICD-10-CM | POA: Diagnosis not present

## 2022-09-20 DIAGNOSIS — M25511 Pain in right shoulder: Secondary | ICD-10-CM | POA: Diagnosis not present

## 2022-10-08 DIAGNOSIS — M25511 Pain in right shoulder: Secondary | ICD-10-CM | POA: Diagnosis not present

## 2022-10-20 DIAGNOSIS — M25511 Pain in right shoulder: Secondary | ICD-10-CM | POA: Diagnosis not present

## 2023-01-26 DIAGNOSIS — L821 Other seborrheic keratosis: Secondary | ICD-10-CM | POA: Diagnosis not present

## 2023-01-26 DIAGNOSIS — L570 Actinic keratosis: Secondary | ICD-10-CM | POA: Diagnosis not present

## 2023-01-26 DIAGNOSIS — D225 Melanocytic nevi of trunk: Secondary | ICD-10-CM | POA: Diagnosis not present

## 2023-01-26 DIAGNOSIS — L814 Other melanin hyperpigmentation: Secondary | ICD-10-CM | POA: Diagnosis not present

## 2023-02-10 IMAGING — CT CT ABD-PELV W/ CM
2 of 5 series · 16 of 46 positions shown, 18 images · IV contrast (iopamidol)
Comparison: No priors.

CLINICAL DATA: 59-year-old male with history of left lower quadrant
abdominal pain for the past 2 days.

EXAM:
CT ABDOMEN AND PELVIS WITH CONTRAST
TECHNIQUE: Multidetector CT imaging of the abdomen and pelvis was performed
using the standard protocol following bolus administration of
intravenous contrast.
CONTRAST:  100mL [id number] IOPAMIDOL ([id number]) INJECTION 61%

[Series 2: abd pelvis 5.00 br40 s3 axial · axial · 0.76mm/px · z∈[+1290,+1735]mm · 13 of 101 slices shown, 15 images]
[im 6/101  soft-tissue]
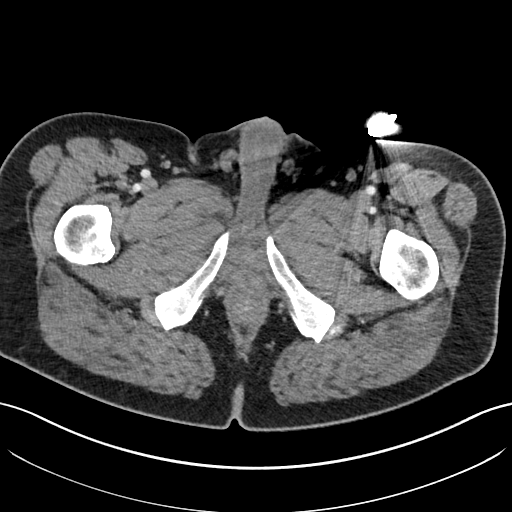
[im 6/101  bone]
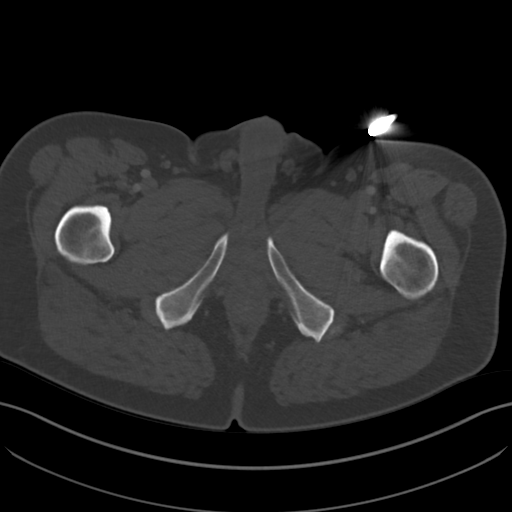
[im 12/101  soft-tissue]
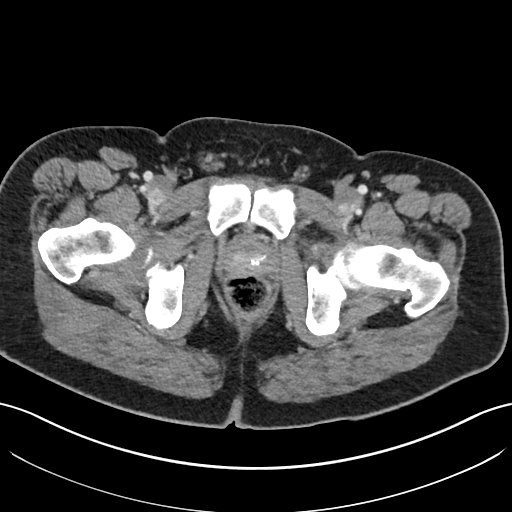
[im 23/101  soft-tissue]
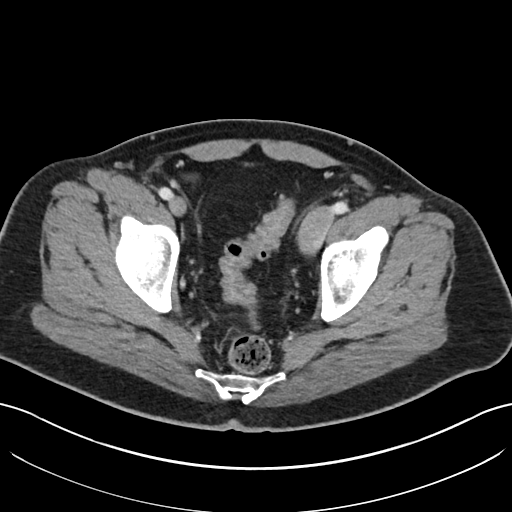
[im 28/101  soft-tissue]
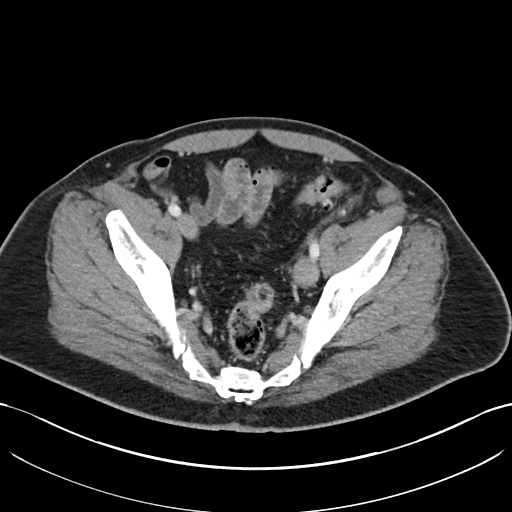
[im 34/101  soft-tissue]
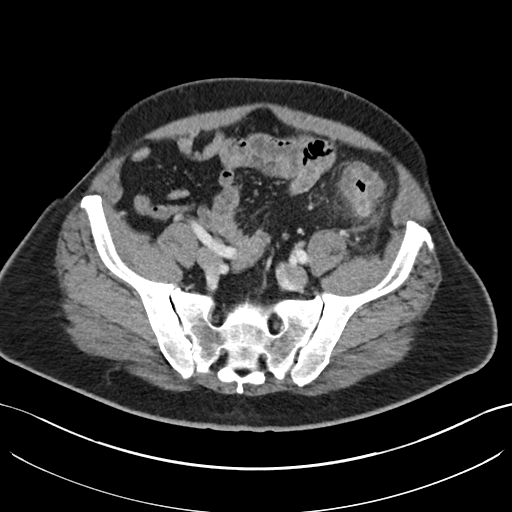
[im 45/101  soft-tissue]
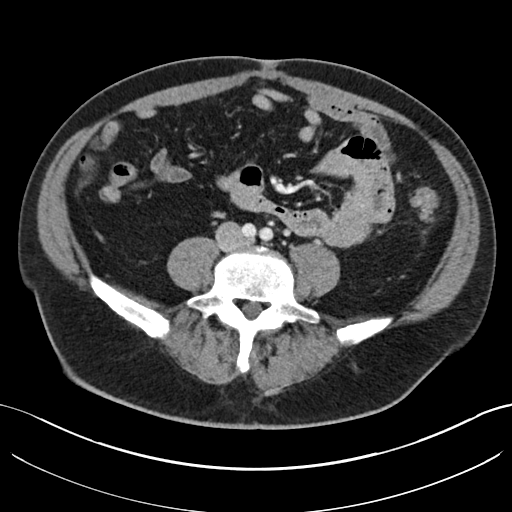
[im 51/101  soft-tissue]
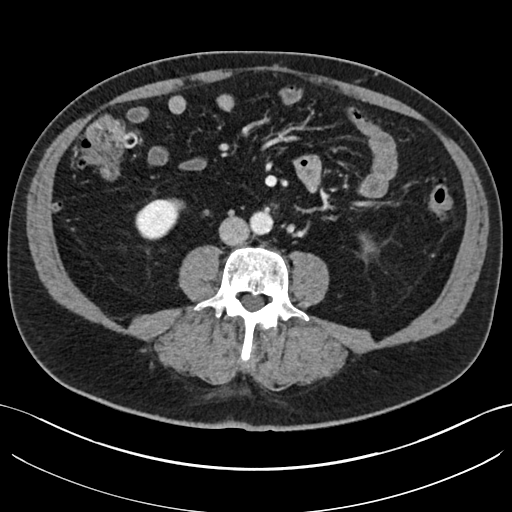
[im 56/101  soft-tissue]
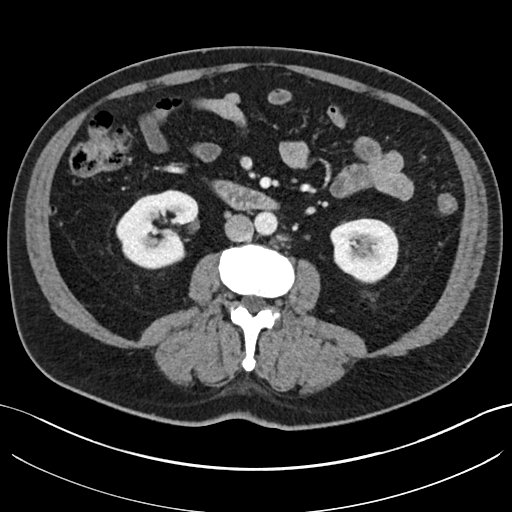
[im 67/101  soft-tissue]
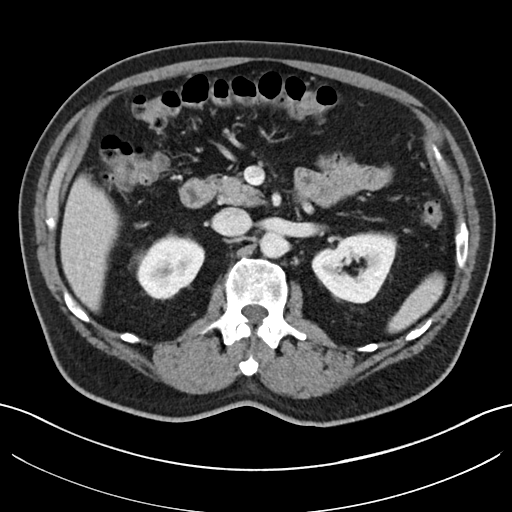
[im 67/101  bone]
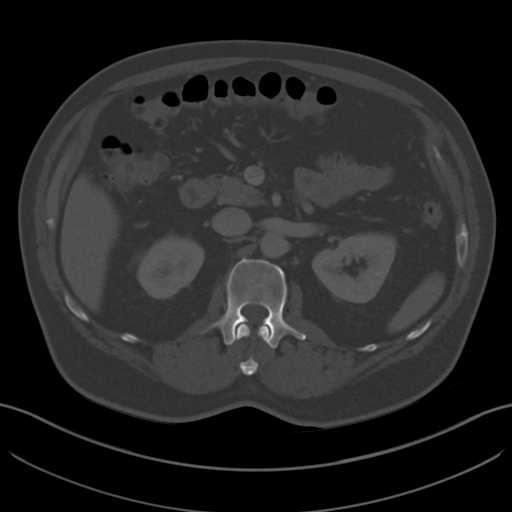
[im 73/101  soft-tissue]
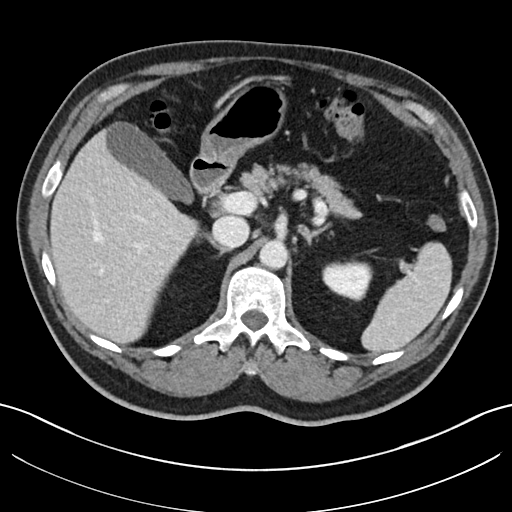
[im 78/101  soft-tissue]
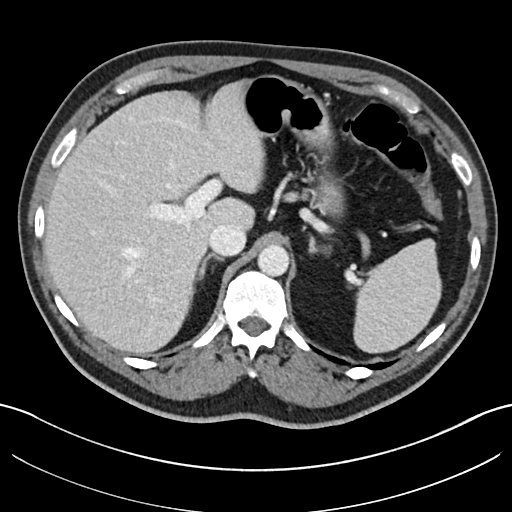
[im 89/101  soft-tissue]
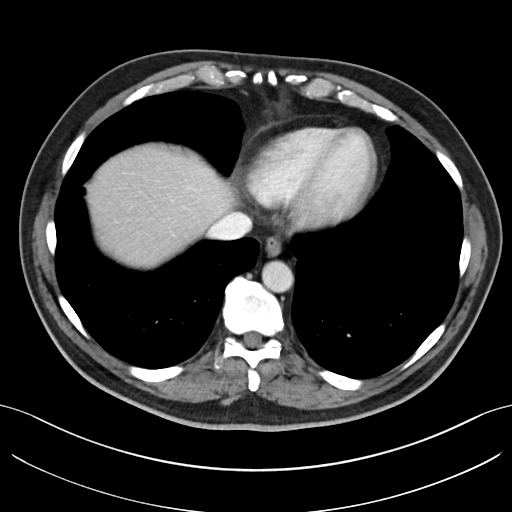
[im 95/101  soft-tissue]
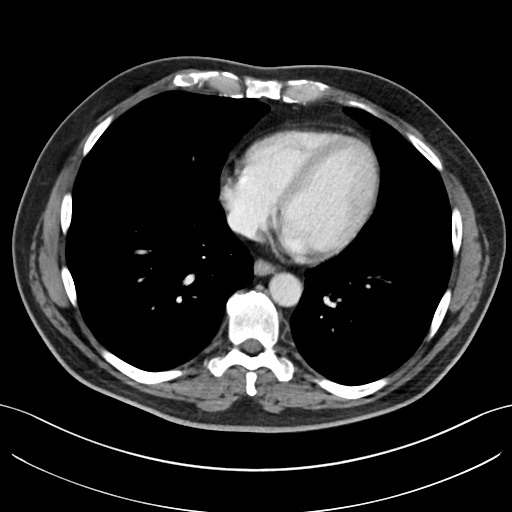

[Series 6: abd pelvis 2.00 br40 s3 cor · coronal · 0.76mm/px · 3 of 192 slices shown]
[im 64/192  soft-tissue]
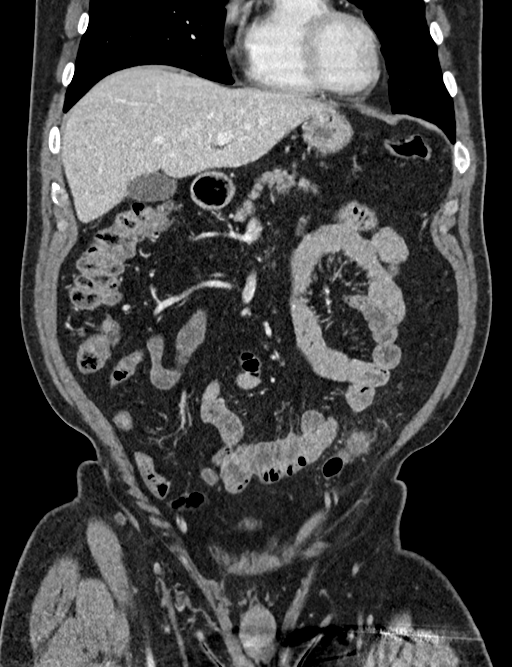
[im 85/192  soft-tissue]
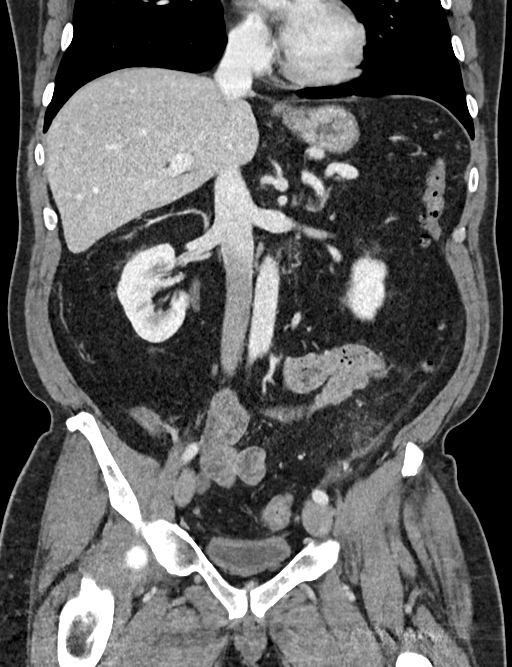
[im 107/192  soft-tissue]
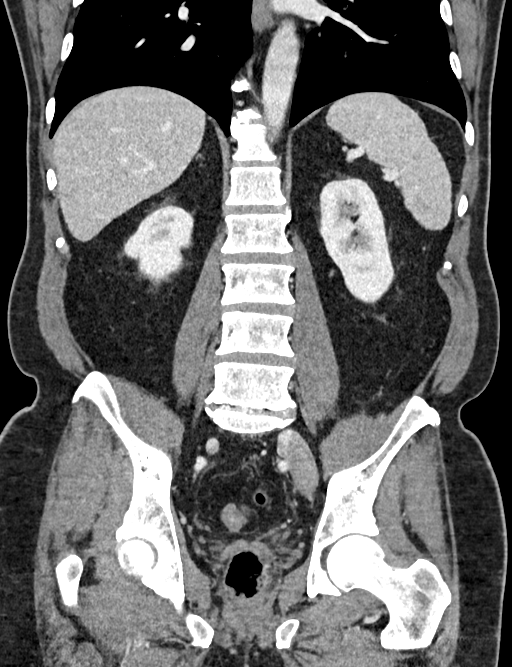

[16 of 46 positions shown; findings below may reference images not displayed]

FINDINGS: Lower chest: Unremarkable.

Hepatobiliary: Subcentimeter low-attenuation lesion in segment 8 of
the liver, too small to characterize, but statistically likely to
represent a tiny cyst. No other suspicious appearing hepatic
lesions. No intra or extrahepatic biliary ductal dilatation.
Gallbladder is normal in appearance.

Pancreas: No pancreatic mass. No pancreatic ductal dilatation. No
pancreatic or peripancreatic fluid collections or inflammatory
changes.

Spleen: Unremarkable.

Adrenals/Urinary Tract: Bilateral kidneys and bilateral adrenal
glands are normal in appearance. No hydroureteronephrosis. Urinary
bladder is normal in appearance.

Stomach/Bowel: Normal appearance of the stomach. No pathologic
dilatation of small bowel or colon. Numerous colonic diverticulae
are noted. In the proximal sigmoid colon there are extensive
surrounding inflammatory changes indicative of an acute
diverticulitis. No discrete diverticular abscess is confidently
identified at this time. No definite signs of frank perforation.
Normal appendix.

Vascular/Lymphatic: Aortic atherosclerosis, without evidence of
aneurysm or dissection in the abdominal or pelvic vasculature. No
lymphadenopathy noted in the abdomen or pelvis.

Reproductive: Prostate gland and seminal vesicles are unremarkable
in appearance.

Other: No significant volume of ascites.  No pneumoperitoneum.

Musculoskeletal: There are no aggressive appearing lytic or blastic
lesions noted in the visualized portions of the skeleton.
IMPRESSION: 1. Acute diverticulitis in the sigmoid colon with extensive
surrounding inflammation, but no definite diverticular abscess or
signs of frank perforation at this time.
2. Aortic atherosclerosis.
3. Additional incidental findings, as above.

## 2023-09-14 DIAGNOSIS — H524 Presbyopia: Secondary | ICD-10-CM | POA: Diagnosis not present

## 2023-10-18 DIAGNOSIS — E785 Hyperlipidemia, unspecified: Secondary | ICD-10-CM | POA: Diagnosis not present

## 2023-10-18 DIAGNOSIS — R7301 Impaired fasting glucose: Secondary | ICD-10-CM | POA: Diagnosis not present

## 2023-10-18 DIAGNOSIS — Z125 Encounter for screening for malignant neoplasm of prostate: Secondary | ICD-10-CM | POA: Diagnosis not present

## 2023-10-27 DIAGNOSIS — Z0000 Encounter for general adult medical examination without abnormal findings: Secondary | ICD-10-CM | POA: Diagnosis not present

## 2023-10-27 DIAGNOSIS — Z23 Encounter for immunization: Secondary | ICD-10-CM | POA: Diagnosis not present

## 2023-10-27 DIAGNOSIS — R82998 Other abnormal findings in urine: Secondary | ICD-10-CM | POA: Diagnosis not present

## 2023-11-03 DIAGNOSIS — E119 Type 2 diabetes mellitus without complications: Secondary | ICD-10-CM | POA: Diagnosis not present

## 2024-01-15 DIAGNOSIS — R0981 Nasal congestion: Secondary | ICD-10-CM | POA: Diagnosis not present

## 2024-01-15 DIAGNOSIS — R051 Acute cough: Secondary | ICD-10-CM | POA: Diagnosis not present

## 2024-02-06 DIAGNOSIS — L821 Other seborrheic keratosis: Secondary | ICD-10-CM | POA: Diagnosis not present

## 2024-02-06 DIAGNOSIS — L570 Actinic keratosis: Secondary | ICD-10-CM | POA: Diagnosis not present

## 2024-02-06 DIAGNOSIS — L718 Other rosacea: Secondary | ICD-10-CM | POA: Diagnosis not present

## 2024-02-06 DIAGNOSIS — D225 Melanocytic nevi of trunk: Secondary | ICD-10-CM | POA: Diagnosis not present

## 2024-08-06 DIAGNOSIS — E119 Type 2 diabetes mellitus without complications: Secondary | ICD-10-CM | POA: Diagnosis not present

## 2024-10-25 ENCOUNTER — Other Ambulatory Visit: Payer: Self-pay | Admitting: Medical Genetics
